# Patient Record
Sex: Male | Born: 1950 | Race: White | Hispanic: No | Marital: Married | Smoking: Former smoker
Health system: Southern US, Community
[De-identification: ages and names within clinical notes are randomized; demographics above are authoritative.]

## PROBLEM LIST (undated history)

## (undated) DIAGNOSIS — J45909 Unspecified asthma, uncomplicated: Secondary | ICD-10-CM

## (undated) DIAGNOSIS — I10 Essential (primary) hypertension: Secondary | ICD-10-CM

## (undated) DIAGNOSIS — J309 Allergic rhinitis, unspecified: Secondary | ICD-10-CM

## (undated) DIAGNOSIS — B3323 Viral pericarditis: Secondary | ICD-10-CM

## (undated) HISTORY — PX: PALATE / UVULA BIOPSY / EXCISION: SUR128

## (undated) HISTORY — PX: TONSILLECTOMY: SUR1361

---

## 2008-05-21 ENCOUNTER — Ambulatory Visit: Payer: Self-pay | Admitting: Family Medicine

## 2010-06-25 HISTORY — PX: CATARACT EXTRACTION: SUR2

## 2014-10-24 HISTORY — PX: OTHER SURGICAL HISTORY: SHX169

## 2015-02-15 HISTORY — PX: CATARACT EXTRACTION: SUR2

## 2015-02-17 ENCOUNTER — Ambulatory Visit
Admission: EM | Admit: 2015-02-17 | Discharge: 2015-02-17 | Disposition: A | Discharge status: Home / Self Care | Payer: BC Managed Care – PPO | Attending: Emergency Medicine | Admitting: Emergency Medicine

## 2015-02-17 ENCOUNTER — Ambulatory Visit: Payer: BC Managed Care – PPO

## 2015-02-17 DIAGNOSIS — M79605 Pain in left leg: Secondary | ICD-10-CM

## 2015-02-17 DIAGNOSIS — M5137 Other intervertebral disc degeneration, lumbosacral region: Secondary | ICD-10-CM | POA: Diagnosis not present

## 2015-02-17 HISTORY — DX: Essential (primary) hypertension: I10

## 2015-02-17 HISTORY — DX: Allergic rhinitis, unspecified: J30.9

## 2015-02-17 HISTORY — DX: Viral pericarditis: B33.23

## 2015-02-17 LAB — URINALYSIS COMPLETE WITH MICROSCOPIC (ARMC ONLY)
Bacteria, UA: NONE SEEN — AB
Bilirubin Urine: NEGATIVE
Glucose, UA: NEGATIVE mg/dL
Hgb urine dipstick: NEGATIVE
Ketones, ur: NEGATIVE mg/dL
Leukocytes, UA: NEGATIVE
Nitrite: NEGATIVE
PROTEIN: NEGATIVE mg/dL
RBC / HPF: NONE SEEN RBC/hpf (ref <3)
Specific Gravity, Urine: 1.01 (ref 1.005–1.030)
WBC UA: NONE SEEN WBC/hpf (ref <3)
pH: 6.5 (ref 5.0–8.0)

## 2015-02-17 MED ORDER — KETOROLAC TROMETHAMINE 60 MG/2ML IM SOLN
60.0000 mg | Freq: Once | INTRAMUSCULAR | Status: AC
  Administered 2015-02-17: 60 mg via INTRAMUSCULAR

## 2015-02-17 MED ORDER — DICLOFENAC SODIUM 75 MG PO TBEC: 75.0000 mg | DELAYED_RELEASE_TABLET | Freq: Two times a day (BID) | ORAL | Status: DC

## 2015-02-17 MED ORDER — PREDNISONE 50 MG PO TABS: 50.0000 mg | ORAL_TABLET | Freq: Every day | ORAL | Status: AC

## 2015-02-17 NOTE — ED Provider Notes (Signed)
CSN: [id number]     Arrival date & time 02/17/15  1833 History   First MD Initiated Contact with Patient 02/17/15 1855     Chief Complaint  Patient presents with  . Hip Pain   (Consider location/radiation/quality/duration/timing/severity/associated sxs/prior Treatment) HPI  64 yo M with left buttocks, left lateral thigh, left anterior shin discomfort since Augustt 11. No trauma reported. Some discomfort wraps around left side and into LLQ- he denies left scrotum. Has had kidney stones before and this is not the same pain. Went for  had massage and it improved but has now returned.Marland Kitchen Has been lifting his wheelchair bound wife in and out of the car while traveling , and has been on his hands and knees cleaning grout in a tile floor at the home. 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 Pain has been increasing in areas noted. Took Valium 5 mg this morning with one Anaprox and had no relief.No numbness or tingling. No difficulty controlling stream or passing/retaining stool. Presents for evaluation.  Had cataract surgery 02/15/15 and noted increase in discomfort thereafter- table supine position Past Medical History  Diagnosis Date  . Hypertension   . Allergic rhinitis   . Pericarditis, viral     History of   Past Surgical History  Procedure Laterality Date  . Cataract extraction Left 02/15/2015  . Cataract extraction Right 2012  . Orbital decompression  10/2014  . Palate / uvula biopsy / excision    . Tonsillectomy     No family history on file. Social History  Substance Use Topics  . Smoking status: Never Smoker   . Smokeless tobacco: None  . Alcohol Use: 0.0 oz/week    0 Standard drinks or equivalent per week     Comment: one drink a day    Review of Systems  Constitutional -afebrile Eyes-denies visual changes ENT- normal voice,denies sore throat CV-denies chest pain Resp-denies SOB GI- negative for nausea,vomiting, diarrhea GU- negative for  dysuria MSK- left buttocks pain intermittent. Left lateral thigh pain, left anteior shin pain-intermittent; no numbness or tingling Skin- denies acute changes Neuro- negative headache,focal weakness or numbness   Allergies  Other and Peanuts  Home Medications   Prior to Admission medications   Medication Sig Start Date End Date Taking? Authorizing Provider  cetirizine (ZYRTEC) 10 MG tablet Take 10 mg by mouth daily.   Yes Historical Provider, MD  desloratadine (CLARINEX) 5 MG tablet Take 5 mg by mouth daily.   Yes Historical Provider, MD  hydrochlorothiazide (HYDRODIURIL) 12.5 MG tablet Take 12.5 mg by mouth daily.   Yes Historical Provider, MD  propranolol (INDERAL) 10 MG tablet Take 10 mg by mouth 2 (two) times daily.   Yes Historical Provider, MD  testosterone (ANDROGEL) 50 MG/5GM (1%) GEL Place 5 g onto the skin daily.   Yes Historical Provider, MD  diclofenac (VOLTAREN) 75 MG EC tablet Take 1 tablet (75 mg total) by mouth 2 (two) times daily. 02/17/15   Rae Halsted, PA-C  predniSONE (DELTASONE) 50 MG tablet Take 1 tablet (50 mg total) by mouth daily. 02/17/15 02/22/15  Rae Halsted, PA-C   Meds Ordered and Administered this Visit   Medications  ketorolac (TORADOL) injection 60 mg (60 mg Intramuscular Given 02/17/15 1938)  Excellent improvement in discomfort with Rx  BP 141/79 mmHg  Pulse 64  Temp(Src) 97.6 F (36.4 C) (Tympanic)  Resp 16  Ht  (1.854 m)  Wt 191 lb (86.637 kg)  BMI 25.20 kg/m2  SpO2 99% No data found.   Physical Exam   Constitutional -alert and oriented,well appearing and in mild/mod distress with positional changes Head-atraumatic, normocephalic Eyes- conjunctiva normal, EOMI ,conjugate gaze Nose- no congestion or rhinorrhea Mouth/throat- mucous membranes moist ,oropharynx non-erythematous Neck- supple without glandular enlargement CV- regular rate, grossly normal heart sounds,  Resp-no distress, normal respiratory effort,clear to auscultation  bilaterally Back- no CVAT; no spinal tenderness, no paraspinous tenderness in cervical or thoracic regions, hips level, low back muscular tenderness predominantly left, Discomfort experienced in left buttocks, left lateral thigh and left anterior shin suggest of radicular origin. SLR eases discomfort on the left and has minimal effect when right is raised DTRs equal ,good pulses,  GI- soft,non-tender,no distention GU- deferred MSK- Right non tender, normal ROM, antalgic gait favoring left, ambulatory, self-care- see Back notes Neuro- normal speech and language, no gross focal neurological deficit appreciated, no gait instability, Skin-warm,dry ,intact; no rash noted Psych-mood and affect grossly normal; speech and behavior grossly normal ED Course  Procedures (including critical care time)  Labs Review Labs Reviewed  URINALYSIS COMPLETEWITH MICROSCOPIC ([facility] ONLY) - Abnormal; Notable for the following:    Color, Urine STRAW (*)    Bacteria, UA NONE SEEN (*)    Squamous Epithelial / LPF 0-5 (*)    All other components within normal limits  URINE CULTURE   Results for orders placed or performed during the hospital encounter of 02/17/15  Urine culture  Result Value Ref Range   Specimen Description URINE, CLEAN CATCH    Special Requests NONE    Culture NO GROWTH < 12 HOURS    Report Status PENDING   Urinalysis complete, with microscopic  Result Value Ref Range   Color, Urine STRAW (A) YELLOW   APPearance CLEAR CLEAR   Glucose, UA NEGATIVE NEGATIVE mg/dL   Bilirubin Urine NEGATIVE NEGATIVE   Ketones, ur NEGATIVE NEGATIVE mg/dL   Specific Gravity, Urine 1.010 1.005 - 1.030   Hgb urine dipstick NEGATIVE NEGATIVE   pH 6.5 5.0 - 8.0   Protein, ur NEGATIVE NEGATIVE mg/dL   Nitrite NEGATIVE NEGATIVE   Leukocytes, UA NEGATIVE NEGATIVE   RBC / HPF NONE SEEN <3 RBC/hpf   WBC, UA NONE SEEN <3 WBC/hpf   Bacteria, UA NONE SEEN (A) RARE   Squamous Epithelial / LPF 0-5 (A) RARE     Imaging Review Dg Lumbar Spine Complete  02/17/2015   CLINICAL DATA:  Patient with lower back and left thigh and buttock pain for 2 weeks.  EXAM: LUMBAR SPINE - COMPLETE 4+ VIEW  COMPARISON:  None.  FINDINGS: Multilevel degenerative disc disease most pronounced L5-S1. L4-5 and L5-S1 facet degenerative changes. Age-indeterminate anterior height loss of the T11, T12 and L1 vertebral bodies. SI joints are unremarkable. Multiple punctate radiodensities projecting over the expected location of the kidneys bilaterally. Nonobstructed bowel gas pattern.  IMPRESSION: Multilevel degenerative disc and facet disease.  Age-indeterminate anterior compression deformities of the T11, T12 and L1 vertebral bodies.  Multiple calcific densities projecting over the expected location of the kidneys bilaterally most compatible with nephrolithiasis.   Electronically Signed   By: Annia Belt M.D.   On: 02/17/2015 19:53   Medications  ketorolac (TORADOL) injection 60 mg (60 mg Intramuscular Given 02/17/15 1938)  Noted improvement in discomfort level with positional changes    MDM   1. DDD (degenerative disc disease), lumbosacral   2. Pain of left leg    Discharge Medication List as of 02/17/2015  8:23  PM    START taking these medications   Details  diclofenac (VOLTAREN) 75 MG EC tablet Take 1 tablet (75 mg total) by mouth 2 (two) times daily., Starting 02/17/2015, Until Discontinued, Normal    predniSONE (DELTASONE) 50 MG tablet Take 1 tablet (50 mg total) by mouth daily., Starting 02/17/2015, Until Tue 02/22/15, Normal       Plan: 1. Test/x-ray results and diagnosis reviewed with patient and wife; DDD discussed, acute flare  2. Rx as per orders; risks, benefits, potential side effects reviewed with patient/wife 3. Recommend supportive treatment with ice paks, bed pillow long leg support,toe touchstretches 4. F/u prn if symptoms worsen or don't improve. Plan f/u with PCP in next few days for coordination of  care    Informational handouts reviewed. Disc will be available tomorrow for pick up   Rae Halsted, PA-C 02/18/15 1646  Follow up call to patient this afternoon. Still uncomfortable but feeling much better. Ice paks are helpful. Used pillows for sleep and was pleased. Has pain Rx available and may use as directed if needed- HS suggested. Did not go to work today -self care. Has appointment with PCP Tuesday AM.  Pleased with call.  Will go by and get Rad disc when in town, probably Monday.   Rae Halsted, PA-C 02/18/15 1650

## 2015-02-17 NOTE — ED Notes (Signed)
Patient states that symptoms started on 02/03/2015. He states that he is having sciatica like pain on his left side. States that he has done some massages. He states that the pain starts in the hip/glute area and radiates down his leg. He states that he had cataract surgery on Tuesday and his symptoms worsened after that. He states that he has been rotating hot and cold today with no improvement. He states that he has also taken Valium and Aleve to help but, states that no relief with meds. He states that he feels like the pain is on his side of his leg and varies. States that he has also had some radiation to his groin. States that he sits a lot due to his job.

## 2015-02-17 NOTE — Discharge Instructions (Signed)
Diclofenac 75mg  1 by mouth twice daily Prednisone 50 mg 1 daily for 5 days  Ice pak/Heat pak as preferred Consultation with PCP for Neurosurgery /Orthopedics referral    Degenerative Disk Disease Degenerative disk disease is a condition caused by the changes that occur in the cushions of the backbone (spinal disks) as you grow older. Spinal disks are soft and compressible disks located between the bones of the spine (vertebrae). They act like shock absorbers. Degenerative disk disease can affect the whole spine. However, the neck and lower back are most commonly affected. Many changes can occur in the spinal disks with aging, such as:  The spinal disks may dry and shrink.  Small tears may occur in the tough, outer covering of the disk (annulus).  The disk space may become smaller due to loss of water.  Abnormal growths in the bone (spurs) may occur. This can put pressure on the nerve roots exiting the spinal canal, causing pain.  The spinal canal may become narrowed. CAUSES  Degenerative disk disease is a condition caused by the changes that occur in the spinal disks with aging. The exact cause is not known, but there is a genetic basis for many patients. Degenerative changes can occur due to loss of fluid in the disk. This makes the disk thinner and reduces the space between the backbones. Small cracks can develop in the outer layer of the disk. This can lead to the breakdown of the disk. You are more likely to get degenerative disk disease if you are overweight. Smoking cigarettes and doing heavy work such as weightlifting can also increase your risk of this condition. Degenerative changes can start after a sudden injury. Growth of bone spurs can compress the nerve roots and cause pain.  SYMPTOMS  The symptoms vary from person to person. Some people may have no pain, while others have severe pain. The pain may be so severe that it can limit your activities. The location of the pain depends  on the part of your backbone that is affected. You will have neck or arm pain if a disk in the neck area is affected. You will have pain in your back, buttocks, or legs if a disk in the lower back is affected. The pain becomes worse while bending, reaching up, or with twisting movements. The pain may start gradually and then get worse as time passes. It may also start after a major or minor injury. You may feel numbness or tingling in the arms or legs.  DIAGNOSIS  Your caregiver will ask you about your symptoms and about activities or habits that may cause the pain. He or she may also ask about any injuries, diseases, or treatments you have had earlier. Your caregiver will examine you to check for the range of movement that is possible in the affected area, to check for strength in your extremities, and to check for sensation in the areas of the arms and legs supplied by different nerve roots. An X-ray of the spine may be taken. Your caregiver may suggest other imaging tests, such as magnetic resonance imaging (MRI), if needed.  TREATMENT  Treatment includes rest, modifying your activities, and applying ice and heat. Your caregiver may prescribe medicines to reduce your pain and may ask you to do some exercises to strengthen your back. In some cases, you may need surgery. You and your caregiver will decide on the treatment that is best for you. HOME CARE INSTRUCTIONS   Follow proper lifting and walking  techniques as advised by your caregiver.  Maintain good posture.  Exercise regularly as advised.  Perform relaxation exercises.  Change your sitting, standing, and sleeping habits as advised. Change positions frequently.  Lose weight as advised.  Stop smoking if you smoke.  Wear supportive footwear. SEEK MEDICAL CARE IF:  Your pain does not go away within 1 to 4 weeks. SEEK IMMEDIATE MEDICAL CARE IF:   Your pain is severe.  You notice weakness in your arms, hands, or legs.  You begin to  lose control of your bladder or bowel movements. MAKE SURE YOU:   Understand these instructions.  Will watch your condition.  Will get help right away if you are not doing well or get worse. Document Released: 04/08/2007 Document Revised: 09/03/2011 Document Reviewed: 10/13/2013 [facility] Patient Information 2015 [place], [place]. This information is not intended to replace advice given to you by your health care provider. Make sure you discuss any questions you have with your health care provider.

## 2015-02-19 LAB — URINE CULTURE: CULTURE: NO GROWTH

## 2016-04-15 ENCOUNTER — Ambulatory Visit
Admission: EM | Admit: 2016-04-15 | Discharge: 2016-04-15 | Disposition: A | Discharge status: Home / Self Care | Payer: BC Managed Care – PPO | Attending: Family Medicine | Admitting: Family Medicine

## 2016-04-15 ENCOUNTER — Encounter: Payer: Self-pay | Admitting: Gynecology

## 2016-04-15 DIAGNOSIS — T148XXA Other injury of unspecified body region, initial encounter: Secondary | ICD-10-CM

## 2016-04-15 DIAGNOSIS — W57XXXA Bitten or stung by nonvenomous insect and other nonvenomous arthropods, initial encounter: Secondary | ICD-10-CM | POA: Diagnosis not present

## 2016-04-15 MED ORDER — MUPIROCIN 2 % EX OINT: 1.0000 "application " | TOPICAL_OINTMENT | Freq: Three times a day (TID) | CUTANEOUS | 0 refills | Status: DC

## 2016-04-15 NOTE — ED Provider Notes (Signed)
CSN: [id number]     Arrival date & time 04/15/16  1057 History   First MD Initiated Contact with Patient 04/15/16 1204     Chief Complaint  Patient presents with  . Insect Bite   (Consider location/radiation/quality/duration/timing/severity/associated sxs/prior Treatment) HPI  65 YO Male presents with insect bites worried may be Lyme disease. Was in woods chopping branches 3 days prior. Never saw or found insects. No verified tick bite. No rash or fever.       Past Medical History:  Diagnosis Date  . Allergic rhinitis   . Hypertension   . Pericarditis, viral    History of   Past Surgical History:  Procedure Laterality Date  . CATARACT EXTRACTION Left 02/15/2015  . CATARACT EXTRACTION Right 2012  . Orbital Decompression  10/2014  . PALATE / UVULA BIOPSY / EXCISION    . TONSILLECTOMY     No family history on file. Social History  Substance Use Topics  . Smoking status: Never Smoker  . Smokeless tobacco: Never Used  . Alcohol use 0.0 oz/week     Comment: one drink a day    Review of Systems  Constitutional: Negative for activity change, chills, fatigue and fever.  Skin: Positive for color change and wound.  All other systems reviewed and are negative.   Allergies  Other and Peanuts [peanut oil]  Home Medications   Prior to Admission medications   Medication Sig Start Date End Date Taking? Authorizing Provider  cetirizine (ZYRTEC) 10 MG tablet Take 10 mg by mouth daily.   Yes Historical Provider, MD  desloratadine (CLARINEX) 5 MG tablet Take 5 mg by mouth daily.   Yes Historical Provider, MD  diclofenac (VOLTAREN) 75 MG EC tablet Take 1 tablet (75 mg total) by mouth 2 (two) times daily. 02/17/15  Yes Rae HalstedLaurie W Lee, PA-C  hydrochlorothiazide (HYDRODIURIL) 12.5 MG tablet Take 12.5 mg by mouth daily.   Yes Historical Provider, MD  propranolol (INDERAL) 10 MG tablet Take 10 mg by mouth 2 (two) times daily.   Yes Historical Provider, MD  testosterone (ANDROGEL) 50  MG/5GM (1%) GEL Place 5 g onto the skin daily.   Yes Historical Provider, MD  mupirocin ointment (BACTROBAN) 2 % Apply 1 application topically 3 (three) times daily. 04/15/16   Lutricia FeilWilliam P Roemer, PA-C   Meds Ordered and Administered this Visit  Medications - No data to display  BP (!) 142/94 (BP Location: Left Arm)   Pulse 64   Temp 97.7 F (36.5 C) (Oral)   Resp 16   Ht 6\' 1"  (1.854 m)   Wt 191 lb (86.6 kg)   SpO2 98%   BMI 25.20 kg/m  No data found.   Physical Exam  Constitutional: He is oriented to person, place, and time. He appears well-developed and well-nourished. No distress.  HENT:  Head: Normocephalic and atraumatic.  Eyes: EOM are normal. Pupils are equal, round, and reactive to light.  Neck: Normal range of motion. Neck supple.  Musculoskeletal: Normal range of motion.  Neurological: He is alert and oriented to person, place, and time.  Skin: Skin is warm and dry. He is not diaphoretic.  Several punctate round bites on arms. Several excoriations linear. No rash   Psychiatric: He has a normal mood and affect. His behavior is normal. Judgment and thought content normal.  Nursing note and vitals reviewed.   Urgent Care Course   Clinical Course    Procedures (including critical care time)  Labs Review Labs Reviewed - No  data to display  Imaging Review No results found.   Visual Acuity Review  Right Eye Distance:   Left Eye Distance:   Bilateral Distance:    Right Eye Near:   Left Eye Near:    Bilateral Near:         MDM   1. Insect bite, initial encounter    Discharge Medication List as of 04/15/2016 12:18 PM    START taking these medications   Details  mupirocin ointment (BACTROBAN) 2 % Apply 1 application topically 3 (three) times daily., Starting Sun 04/15/2016, Normal      Plan: 1. Test/x-ray results and diagnosis reviewed with patient 2. rx as per orders; risks, benefits, potential side effects reviewed with patient 3. Recommend  supportive treatment with use Bactroban to prevent 2nd infection. Watch for fevers or rash. Preventive measures discussed. 4. F/u prn if symptoms worsen or don't improve     Lutricia Feil, PA-C 04/15/16 1229

## 2016-04-15 NOTE — ED Triage Notes (Signed)
Per patient working in yard x 3 days ago and notice insect bites on bilateral arm.

## 2016-11-23 IMAGING — CR DG LUMBAR SPINE COMPLETE 4+V
5 series · 5 of 5 positions shown · non-contrast
Comparison: None.

CLINICAL DATA: Patient with lower back and left thigh and buttock
pain for 2 weeks.

EXAM:
LUMBAR SPINE - COMPLETE 4+ VIEW

[l-spine ap]
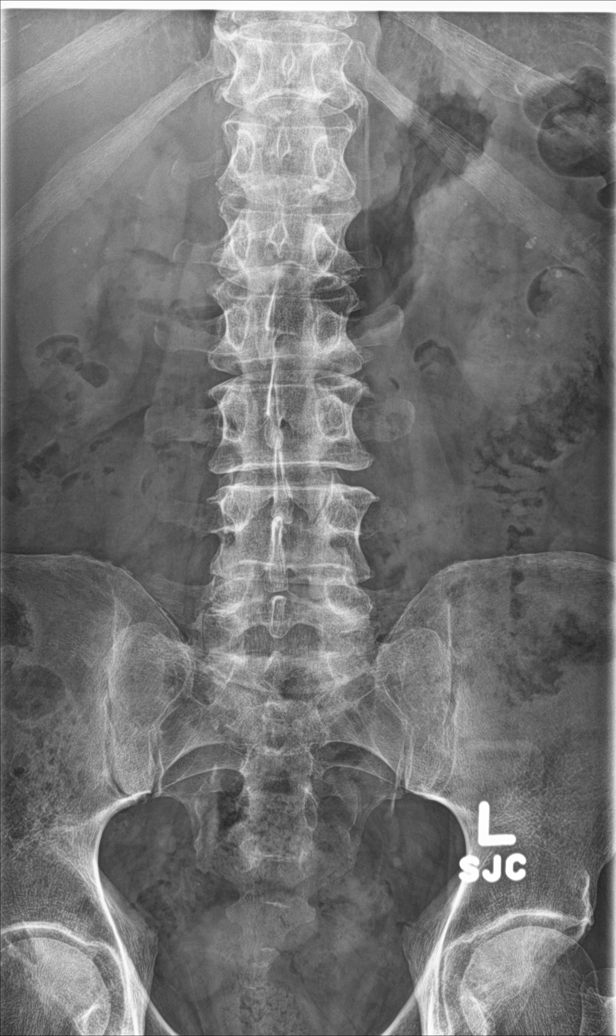

[l-spine obl (1 of 2)]
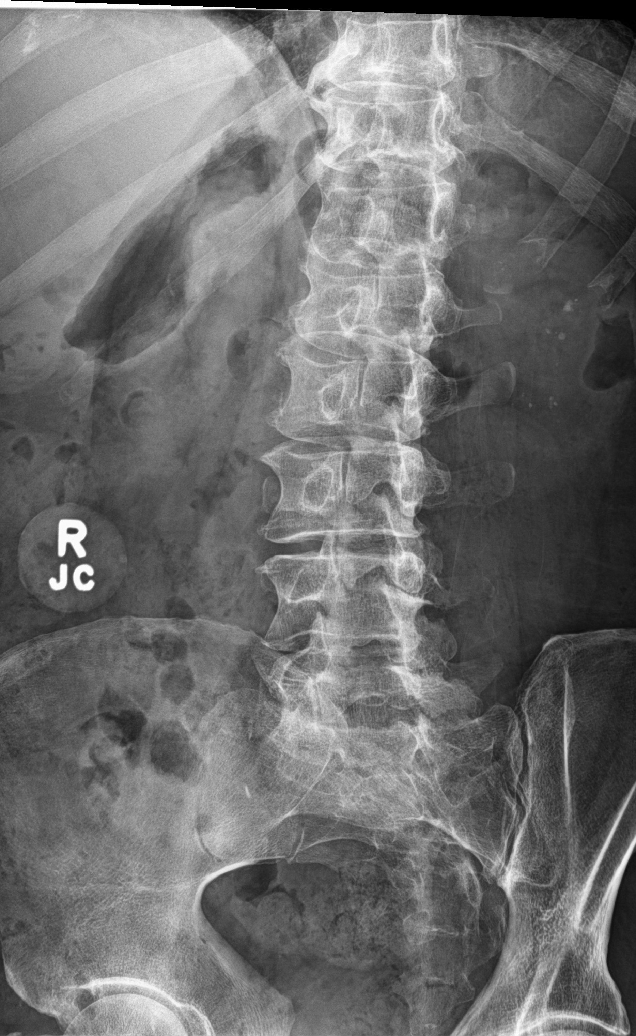

[l-spine obl (2 of 2)]
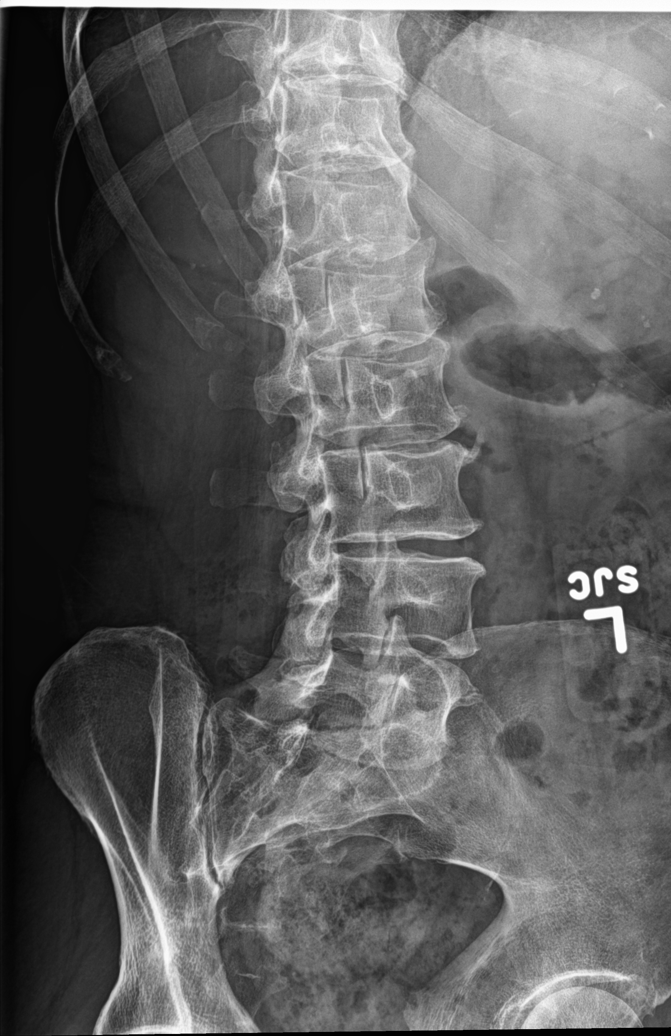

[l-spine lat]
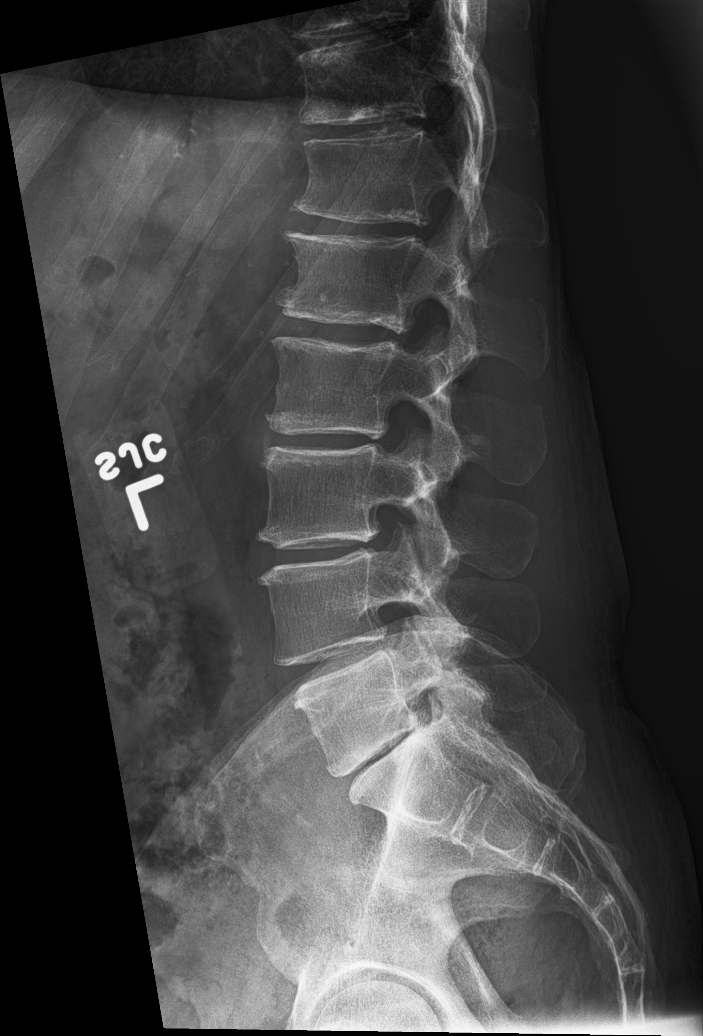

[facial lat_copy]
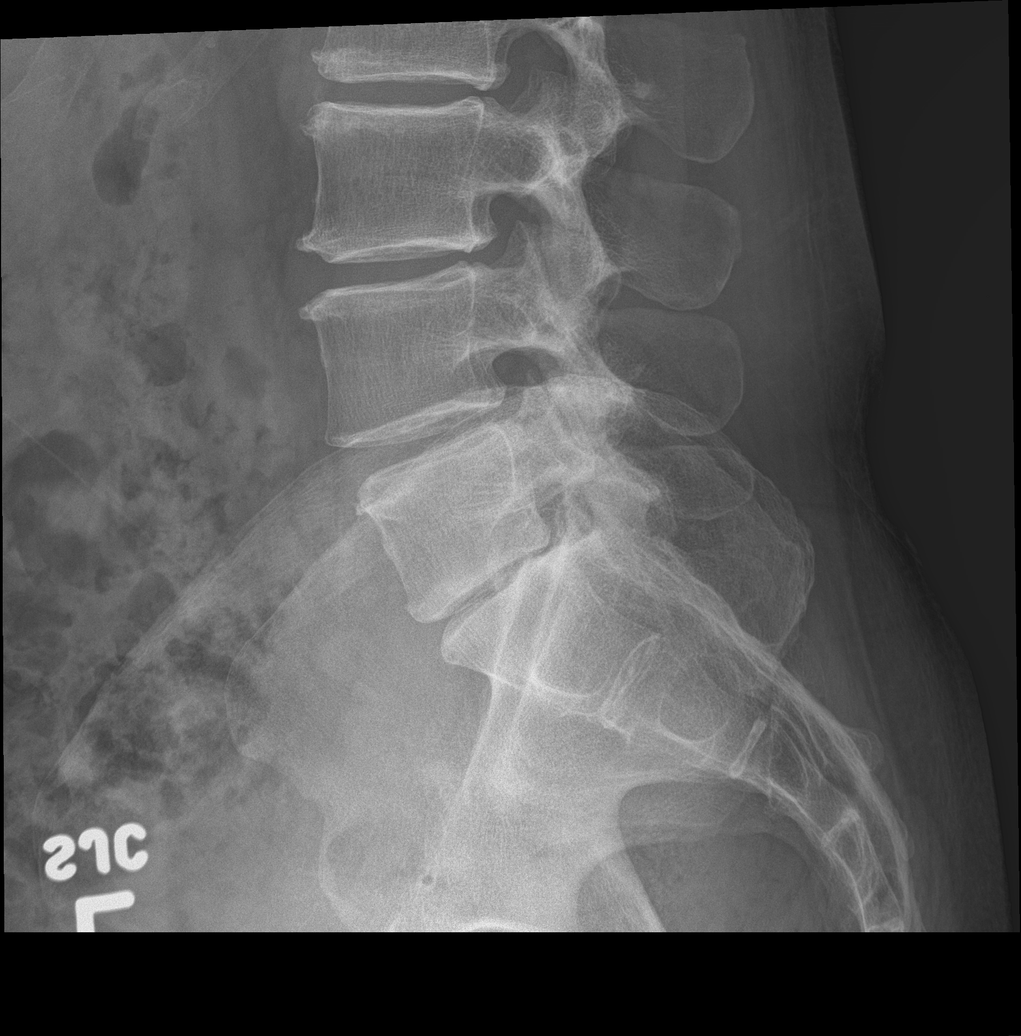

[5 of 5 positions shown; findings below may reference images not displayed]

FINDINGS: Multilevel degenerative disc disease most pronounced L5-S1. L4-5 and
L5-S1 facet degenerative changes. Age-indeterminate anterior height
loss of the T11, T12 and L1 vertebral bodies. SI joints are
unremarkable. Multiple punctate radiodensities projecting over the
expected location of the kidneys bilaterally. Nonobstructed bowel
gas pattern.
IMPRESSION: Multilevel degenerative disc and facet disease.

Age-indeterminate anterior compression deformities of the T11, T12
and L1 vertebral bodies.

Multiple calcific densities projecting over the expected location of
the kidneys bilaterally most compatible with nephrolithiasis.

## 2018-02-05 ENCOUNTER — Other Ambulatory Visit: Payer: Self-pay

## 2018-02-05 ENCOUNTER — Ambulatory Visit
Admission: EM | Admit: 2018-02-05 | Discharge: 2018-02-05 | Disposition: A | Discharge status: Home / Self Care | Payer: BC Managed Care – PPO | Attending: Family Medicine | Admitting: Family Medicine

## 2018-02-05 ENCOUNTER — Encounter: Payer: Self-pay | Admitting: Emergency Medicine

## 2018-02-05 DIAGNOSIS — M79671 Pain in right foot: Secondary | ICD-10-CM | POA: Diagnosis not present

## 2018-02-05 DIAGNOSIS — M10071 Idiopathic gout, right ankle and foot: Secondary | ICD-10-CM

## 2018-02-05 DIAGNOSIS — R2241 Localized swelling, mass and lump, right lower limb: Secondary | ICD-10-CM | POA: Diagnosis not present

## 2018-02-05 DIAGNOSIS — M109 Gout, unspecified: Secondary | ICD-10-CM

## 2018-02-05 HISTORY — DX: Unspecified asthma, uncomplicated: J45.909

## 2018-02-05 MED ORDER — PREDNISONE 10 MG PO TABS: ORAL_TABLET | ORAL | 0 refills | Status: AC

## 2018-02-05 NOTE — Discharge Instructions (Signed)
I suspect this is gout.  Prednisone as prescribed.  Have your primary re-examine.  Take care  Dr. Adriana Simasook

## 2018-02-05 NOTE — ED Triage Notes (Signed)
Patient in today c/o right foot pain, redness and swelling upon waking this morning. No injury noted.

## 2018-02-05 NOTE — ED Provider Notes (Signed)
[facility]    CSN: [id number] Arrival date & time: 02/05/18  1000  History   Chief Complaint Chief Complaint  Patient presents with  . Foot Swelling    right   HPI  67 year old male presents with the above complaints.  Patient reports that he awoke this morning with right foot pain, redness, and swelling.  The main area that is affected is the right great toe.  The redness extends slightly proximally.  Associated swelling.  Very tender.  Pain is quite severe.  No fall, trauma, injury.  No breaks in the skin.  No fevers or chills.  Patient is concerned that he may have gout.  No history of gout.  He does drink alcohol regularly.  He is also on a potential precipitating medication, HCTZ.  No other associated symptoms.  Exacerbated by touch/activity.  No other complaints.  Past Medical History:  Diagnosis Date  . Allergic rhinitis   . Asthma   . Hypertension   . Pericarditis, viral    History of   Past Surgical History:  Procedure Laterality Date  . CATARACT EXTRACTION Left 02/15/2015  . CATARACT EXTRACTION Right 2012  . Orbital Decompression  10/2014  . PALATE / UVULA BIOPSY / EXCISION    . TONSILLECTOMY     Home Medications    Prior to Admission medications   Medication Sig Start Date End Date Taking? Authorizing Provider  desloratadine (CLARINEX) 5 MG tablet Take 5 mg by mouth daily.   Yes [provider]  hydrochlorothiazide (HYDRODIURIL) 25 MG tablet Take 1 tablet by mouth daily. 01/24/17  Yes [provider]  propranolol (INDERAL) 10 MG tablet Take 10 mg by mouth 2 (two) times daily.   Yes [provider]  ranitidine (ZANTAC) 150 MG tablet Take 1 tablet by mouth at bedtime.   Yes [provider]  SPIRIVA HANDIHALER 18 MCG inhalation capsule PLACE 1 C INTO INHALER AND INHALE ONCE D 01/22/18  Yes [provider]  testosterone (ANDROGEL) 50 MG/5GM (1%) GEL Place 5 g onto the skin daily.   Yes [provider]    predniSONE (DELTASONE) 10 MG tablet 50 mg daily x 3 days, then 40 mg daily x 3 days, then 30 mg daily x 3 days, then 20 mg daily x 3 days, then 10 mg daily x 3 days. 02/05/18   Tommie Samsook, Zenaya Ulatowski G, DO    Family History Family History  Problem Relation Age of Onset  . Cancer Mother        small bowel, gall bladder  . Esophageal cancer Father   . Hypertension Father     Social History Social History   Tobacco Use  . Smoking status: Former Smoker    Last attempt to quit: 02/05/1993    Years since quitting: 25.0  . Smokeless tobacco: Never Used  Substance Use Topics  . Alcohol use: Yes    Alcohol/week: 4.0 standard drinks    Types: 4 Standard drinks or equivalent per week    Comment: 4 martinis a week  . Drug use: No     Allergies   Other and Peanuts [peanut oil]   Review of Systems Review of Systems  Constitutional: Negative.   Musculoskeletal:       Right great toe pain, swelling, redness.    Physical Exam Triage Vital Signs ED Triage Vitals  Enc Vitals Group     BP 02/05/18 1010 (!) 145/75     Pulse Rate 02/05/18 1010 90  Resp 02/05/18 1010 18     Temp 02/05/18 1010 98.3 F (36.8 C)     Temp Source 02/05/18 1010 Oral     SpO2 02/05/18 1010 99 %     Weight 02/05/18 1010 182 lb (82.6 kg)     Height 02/05/18 1010 6\' 1"  (1.854 m)     Head Circumference --      Peak Flow --      Pain Score 02/05/18 1009 7     Pain Loc --      Pain Edu? --      Excl. in GC? --    Updated Vital Signs BP (!) 145/75 (BP Location: Left Arm)   Pulse 90   Temp 98.3 F (36.8 C) (Oral)   Resp 18   Ht 6\' 1"  (1.854 m)   Wt 82.6 kg   SpO2 99%   BMI 24.01 kg/m   Visual Acuity Right Eye Distance:   Left Eye Distance:   Bilateral Distance:    Right Eye Near:   Left Eye Near:    Bilateral Near:     Physical Exam  Constitutional: He is oriented to person, place, and time. He appears well-developed. No distress.  HENT:  Head: Normocephalic and atraumatic.  Pulmonary/Chest:  Effort normal. No respiratory distress.  Musculoskeletal:  Right great toe with erythema and exquisite tenderness to palpation.  Swelling noted at the MTP joint.  Mild erythema noted on the dorsum of the foot.  Does blanch.  Warmth noted.  No apparent wounds or breaks in the skin.  Neurological: He is alert and oriented to person, place, and time.  Psychiatric: He has a normal mood and affect. His behavior is normal.  Nursing note and vitals reviewed.  UC Treatments / Results  Labs (all labs ordered are listed, but only abnormal results are displayed) Labs Reviewed - No data to display  EKG None  Radiology No results found.  Procedures Procedures (including critical care time)  Medications Ordered in UC Medications - No data to display  Initial Impression / Assessment and Plan / UC Course  I have reviewed the triage vital signs and the nursing notes.  Pertinent labs & imaging results that were available during my care of the patient were reviewed by me and considered in my medical decision making (see chart for details).    67 year old male presents with suspected gout.  Treating with prednisone.  Advised close follow-up with primary care physician.  Final Clinical Impressions(s) / UC Diagnoses   Final diagnoses:  Acute gout involving toe of right foot, unspecified cause     Discharge Instructions     I suspect this is gout.  Prednisone as prescribed.  Have your primary re-examine.  Take care  Dr. Adriana Simasook    ED Prescriptions    Medication Sig Dispense Auth. Provider   predniSONE (DELTASONE) 10 MG tablet 50 mg daily x 3 days, then 40 mg daily x 3 days, then 30 mg daily x 3 days, then 20 mg daily x 3 days, then 10 mg daily x 3 days. 45 tablet Tommie Samsook, Jamiria Langill G, DO     Controlled Substance Prescriptions [place] Controlled Substance Registry consulted? Not Applicable   Tommie SamsCook, Aydrian Halpin G, DO 02/05/18 1037
